# Patient Record
Sex: Female | Born: 1995 | Race: Black or African American | Hispanic: No | Marital: Single | Smoking: Never smoker
Health system: Southern US, Community
[De-identification: ages and names within clinical notes are randomized; demographics above are authoritative.]

## PROBLEM LIST (undated history)

## (undated) HISTORY — PX: TONSILLECTOMY: SUR1361

---

## 2018-01-06 ENCOUNTER — Emergency Department (HOSPITAL_COMMUNITY)
Admission: EM | Admit: 2018-01-06 | Discharge: 2018-01-07 | Disposition: A | Discharge status: Home / Self Care | Payer: Self-pay | Attending: Emergency Medicine | Admitting: Emergency Medicine

## 2018-01-06 ENCOUNTER — Encounter (HOSPITAL_COMMUNITY): Payer: Self-pay

## 2018-01-06 DIAGNOSIS — Z5321 Procedure and treatment not carried out due to patient leaving prior to being seen by health care provider: Secondary | ICD-10-CM | POA: Insufficient documentation

## 2018-01-06 LAB — URINALYSIS, ROUTINE W REFLEX MICROSCOPIC
Bilirubin Urine: NEGATIVE
Glucose, UA: NEGATIVE mg/dL
HGB URINE DIPSTICK: NEGATIVE
Ketones, ur: NEGATIVE mg/dL
Nitrite: NEGATIVE
Protein, ur: NEGATIVE mg/dL
Specific Gravity, Urine: 1.019 (ref 1.005–1.030)
pH: 6 (ref 5.0–8.0)

## 2018-01-06 LAB — POC URINE PREG, ED: Preg Test, Ur: NEGATIVE

## 2018-01-06 NOTE — ED Triage Notes (Signed)
Pt reports vaginal itching and burning for the past couple days, would like to be checked for a yeast infection.

## 2018-01-07 NOTE — ED Notes (Addendum)
Pt called for vital sign reassessment x3, no answer again.

## 2018-01-07 NOTE — ED Notes (Signed)
No answer for vitals recheck x3 

## 2018-07-15 ENCOUNTER — Other Ambulatory Visit: Payer: Self-pay

## 2018-07-15 ENCOUNTER — Emergency Department (HOSPITAL_COMMUNITY): Payer: No Typology Code available for payment source

## 2018-07-15 ENCOUNTER — Emergency Department (HOSPITAL_COMMUNITY)
Admission: EM | Admit: 2018-07-15 | Discharge: 2018-07-15 | Disposition: A | Discharge status: Home / Self Care | Payer: No Typology Code available for payment source | Attending: Emergency Medicine | Admitting: Emergency Medicine

## 2018-07-15 ENCOUNTER — Encounter (HOSPITAL_COMMUNITY): Payer: Self-pay | Admitting: *Deleted

## 2018-07-15 DIAGNOSIS — Y999 Unspecified external cause status: Secondary | ICD-10-CM | POA: Diagnosis not present

## 2018-07-15 DIAGNOSIS — Y9389 Activity, other specified: Secondary | ICD-10-CM | POA: Insufficient documentation

## 2018-07-15 DIAGNOSIS — Y92410 Unspecified street and highway as the place of occurrence of the external cause: Secondary | ICD-10-CM | POA: Diagnosis not present

## 2018-07-15 DIAGNOSIS — M5489 Other dorsalgia: Secondary | ICD-10-CM | POA: Insufficient documentation

## 2018-07-15 DIAGNOSIS — M7918 Myalgia, other site: Secondary | ICD-10-CM

## 2018-07-15 LAB — POC URINE PREG, ED: Preg Test, Ur: NEGATIVE

## 2018-07-15 MED ORDER — METHOCARBAMOL 500 MG PO TABS: 500.0000 mg | ORAL_TABLET | Freq: Every evening | ORAL | 0 refills | Status: AC | PRN
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
## 2018-07-15 NOTE — ED Notes (Signed)
Pt ambulatory to room with no apparent distress.

## 2018-07-15 NOTE — ED Notes (Signed)
Patient transported to X-ray 
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
## 2018-07-15 NOTE — Discharge Instructions (Addendum)

## 2020-09-07 ENCOUNTER — Emergency Department
Admission: EM | Admit: 2020-09-07 | Discharge: 2020-09-07 | Disposition: A | Discharge status: Home / Self Care | Payer: Self-pay | Attending: Emergency Medicine | Admitting: Emergency Medicine

## 2020-09-07 ENCOUNTER — Other Ambulatory Visit: Payer: Self-pay

## 2020-09-07 DIAGNOSIS — L740 Miliaria rubra: Secondary | ICD-10-CM | POA: Insufficient documentation

## 2020-09-07 DIAGNOSIS — L259 Unspecified contact dermatitis, unspecified cause: Secondary | ICD-10-CM | POA: Insufficient documentation

## 2020-09-07 MED ORDER — PREDNISONE 50 MG PO TABS: 50.0000 mg | ORAL_TABLET | Freq: Every day | ORAL | 0 refills | Status: AC

## 2020-09-07 MED ORDER — CETIRIZINE HCL 10 MG PO TABS: 20.0000 mg | ORAL_TABLET | Freq: Every day | ORAL | 0 refills | Status: AC

## 2020-09-07 MED ORDER — TRIAMCINOLONE ACETONIDE 0.1 % EX CREA: 1.0000 "application " | TOPICAL_CREAM | Freq: Four times a day (QID) | CUTANEOUS | 0 refills | Status: AC

## 2020-09-07 NOTE — ED Triage Notes (Signed)
Pt states she just got back from a trip to [facility] and noticed a rash on her legs and arms- pt states it is itchy- pt denies any allergies- pt states it started 2 days ago and has gradually gotten worse

## 2020-09-07 NOTE — ED Provider Notes (Signed)
[facility] Emergency Department Provider Note  ____________________________________________  Time seen: Approximately 7:25 PM  I have reviewed the triage vital signs and the nursing notes.   HISTORY  Chief Complaint Rash    HPI Helen Hernandez is a 25 y.o. female who presents the emergency department complaining of a rash.  Patient states that she has a pruritic rash that started 2 days ago and has gradually worsened.  She states that she is just her back from [place].  No new foods, medications, topicals, soaps or shampoos.  She states that they are small little bumps that are pruritic in nature.  No pain.  No fevers or chills.  No other systemic complaints.  No history of allergic reactions to foods or medications.       History reviewed. No pertinent past medical history.  There are no problems to display for this patient.   Past Surgical History:  Procedure Laterality Date   TONSILLECTOMY      Prior to Admission medications   Medication Sig Start Date End Date Taking? Authorizing Provider  cetirizine (ZYRTEC) 10 MG tablet Take 2 tablets (20 mg total) by mouth daily for 10 days. 09/07/20 09/17/20 Yes Marg Macmaster, Delorise Royals, PA-C  predniSONE (DELTASONE) 50 MG tablet Take 1 tablet (50 mg total) by mouth daily with breakfast. 09/07/20  Yes Donn Zanetti, Delorise Royals, PA-C  triamcinolone cream (KENALOG) 0.1 % Apply 1 application topically 4 (four) times daily. 09/07/20  Yes Josephine Rudnick, Delorise Royals, PA-C    Allergies Patient has no known allergies.  No family history on file.  Social History Social History   Tobacco Use   Smoking status: Never     Review of Systems  Constitutional: No fever/chills Eyes: No visual changes. No discharge ENT: No upper respiratory complaints. Cardiovascular: no chest pain. Respiratory: no cough. No SOB. Gastrointestinal: No abdominal pain.  No nausea, no vomiting.  No diarrhea.  No constipation. Musculoskeletal: Negative  for musculoskeletal pain. Skin: Positive for pruritic rash Neurological: Negative for headaches, focal weakness or numbness.  10 System ROS otherwise negative.  ____________________________________________   PHYSICAL EXAM:  VITAL SIGNS: ED Triage Vitals  Enc Vitals Group     BP 09/07/20 1635 112/73     Pulse Rate 09/07/20 1635 (!) 56     Resp 09/07/20 1635 16     Temp 09/07/20 1635 99 F (37.2 C)     Temp Source 09/07/20 1635 Oral     SpO2 09/07/20 1635 99 %     Weight 09/07/20 1634 186 lb (84.4 kg)     Height 09/07/20 1634 5\' 5"  (1.651 m)     Head Circumference --      Peak Flow --      Pain Score 09/07/20 1634 0     Pain Loc --      Pain Edu? --      Excl. in GC? --      Constitutional: Alert and oriented. Well appearing and in no acute distress. Eyes: Conjunctivae are normal. PERRL. EOMI. Head: Atraumatic. ENT:      Ears:       Nose: No congestion/rhinnorhea.      Mouth/Throat: Mucous membranes are moist.  Neck: No stridor.    Cardiovascular: Normal rate, regular rhythm. Normal S1 and S2.  Good peripheral circulation. Respiratory: Normal respiratory effort without tachypnea or retractions. Lungs CTAB. Good air entry to the bases with no decreased or absent breath sounds. Musculoskeletal: Full range of motion to all extremities. No gross  deformities appreciated. Neurologic:  Normal speech and language. No gross focal neurologic deficits are appreciated.  Skin:  Skin is warm, dry and intact.  Visualization of the majority of patient's skin reveals small maculopapular rash with no excoriations or cellulitic changes.  Patient has a collection to the right upper arm that appears more to be milia rubra than a true maculopapular rash.  However the remainder are scattered erythematous macular papular style lesions. Psychiatric: Mood and affect are normal. Speech and behavior are normal. Patient exhibits appropriate insight and  judgement.   ____________________________________________   LABS (all labs ordered are listed, but only abnormal results are displayed)  Labs Reviewed - No data to display ____________________________________________  EKG   ____________________________________________  RADIOLOGY   No results found.  ____________________________________________    PROCEDURES  Procedure(s) performed:    Procedures    Medications - No data to display   ____________________________________________   INITIAL IMPRESSION / ASSESSMENT AND PLAN / ED COURSE  Pertinent labs & imaging results that were available during my care of the patient were reviewed by me and considered in my medical decision making (see chart for details).  Review of the [place] CSRS was performed in accordance of the [facility] prior to dispensing any controlled drugs.           Patient's diagnosis is consistent with contact dermatitis, milia rubra.  Patient presented to the emergency department with a pruritic rash x2 days.  Patient has no systemic complaints in regards to fevers, chills, URI symptoms, body aches.  At this time there is no evidence of monkey box based off her rash.  Patient has what appears to be a contact dermatitis except 1 area that is very concentrated in the right upper extremity which appears more to be milia rubra.  I have recommended exfoliating soaps and scrubs at home as well as putting the patient on oral antihistamine and steroid to ensure that this is not a contact dermatitis.  Return precautions discussed with the patient.  Follow-up primary care as needed..  Patient is given ED precautions to return to the ED for any worsening or new symptoms.     ____________________________________________  FINAL CLINICAL IMPRESSION(S) / ED DIAGNOSES  Final diagnoses:  Contact dermatitis, unspecified contact dermatitis type, unspecified trigger  Miliaria rubra      NEW MEDICATIONS STARTED DURING  THIS VISIT:  ED Discharge Orders          Ordered    predniSONE (DELTASONE) 50 MG tablet  Daily with breakfast        09/07/20 1930    cetirizine (ZYRTEC) 10 MG tablet  Daily        09/07/20 1930    triamcinolone cream (KENALOG) 0.1 %  4 times daily        09/07/20 1930                This chart was dictated using voice recognition software/Dragon. Despite best efforts to proofread, errors can occur which can change the meaning. Any change was purely unintentional.    Racheal Patches, PA-C 09/07/20 1932    Phineas Semen, MD 09/07/20 2017

## 2020-12-25 ENCOUNTER — Other Ambulatory Visit: Payer: Self-pay

## 2020-12-25 ENCOUNTER — Encounter: Payer: Self-pay | Admitting: Emergency Medicine

## 2020-12-25 ENCOUNTER — Emergency Department: Payer: Medicaid Other

## 2020-12-25 DIAGNOSIS — R519 Headache, unspecified: Secondary | ICD-10-CM | POA: Insufficient documentation

## 2020-12-25 DIAGNOSIS — J029 Acute pharyngitis, unspecified: Secondary | ICD-10-CM | POA: Insufficient documentation

## 2020-12-25 DIAGNOSIS — R059 Cough, unspecified: Secondary | ICD-10-CM | POA: Insufficient documentation

## 2020-12-25 DIAGNOSIS — Z5321 Procedure and treatment not carried out due to patient leaving prior to being seen by health care provider: Secondary | ICD-10-CM | POA: Insufficient documentation

## 2020-12-25 DIAGNOSIS — R0981 Nasal congestion: Secondary | ICD-10-CM | POA: Insufficient documentation

## 2020-12-25 DIAGNOSIS — Z20822 Contact with and (suspected) exposure to covid-19: Secondary | ICD-10-CM | POA: Insufficient documentation

## 2020-12-25 NOTE — ED Triage Notes (Addendum)
Patient ambulatory to triage with steady gait, without difficulty or distress noted; pt reports since Sunday having HA, cough, congestion and sore throat

## 2020-12-26 ENCOUNTER — Emergency Department
Admission: EM | Admit: 2020-12-26 | Discharge: 2020-12-26 | Disposition: A | Discharge status: Home / Self Care | Payer: Medicaid Other | Attending: Emergency Medicine | Admitting: Emergency Medicine

## 2020-12-26 LAB — RESP PANEL BY RT-PCR (FLU A&B, COVID) ARPGX2
Influenza A by PCR: NEGATIVE
Influenza B by PCR: NEGATIVE
SARS Coronavirus 2 by RT PCR: NEGATIVE

## 2020-12-26 LAB — GROUP A STREP BY PCR: Group A Strep by PCR: NOT DETECTED

## 2021-02-12 ENCOUNTER — Other Ambulatory Visit: Payer: Self-pay

## 2021-02-12 ENCOUNTER — Encounter: Payer: Self-pay | Admitting: Emergency Medicine

## 2021-02-12 ENCOUNTER — Emergency Department
Admission: EM | Admit: 2021-02-12 | Discharge: 2021-02-12 | Disposition: A | Discharge status: Home / Self Care | Payer: No Typology Code available for payment source | Attending: Emergency Medicine | Admitting: Emergency Medicine

## 2021-02-12 DIAGNOSIS — S0990XA Unspecified injury of head, initial encounter: Secondary | ICD-10-CM | POA: Diagnosis not present

## 2021-02-12 DIAGNOSIS — Y92410 Unspecified street and highway as the place of occurrence of the external cause: Secondary | ICD-10-CM | POA: Insufficient documentation

## 2021-02-12 DIAGNOSIS — S199XXA Unspecified injury of neck, initial encounter: Secondary | ICD-10-CM | POA: Diagnosis present

## 2021-02-12 DIAGNOSIS — M542 Cervicalgia: Secondary | ICD-10-CM
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
## 2021-02-12 NOTE — ED Notes (Signed)
Pt provided discharge instructions and prescription information. Pt was given the opportunity to ask questions and questions were answered. Discharge signature not obtained in the setting of the COVID-19 pandemic in order to reduce high touch surfaces.  ° °
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
## 2022-09-09 IMAGING — CR DG CHEST 2V
2 series · 2 of 2 positions shown · non-contrast
Comparison: None.

CLINICAL DATA: Cough with headache and congestion.

EXAM:
CHEST - 2 VIEW

[chest pa]
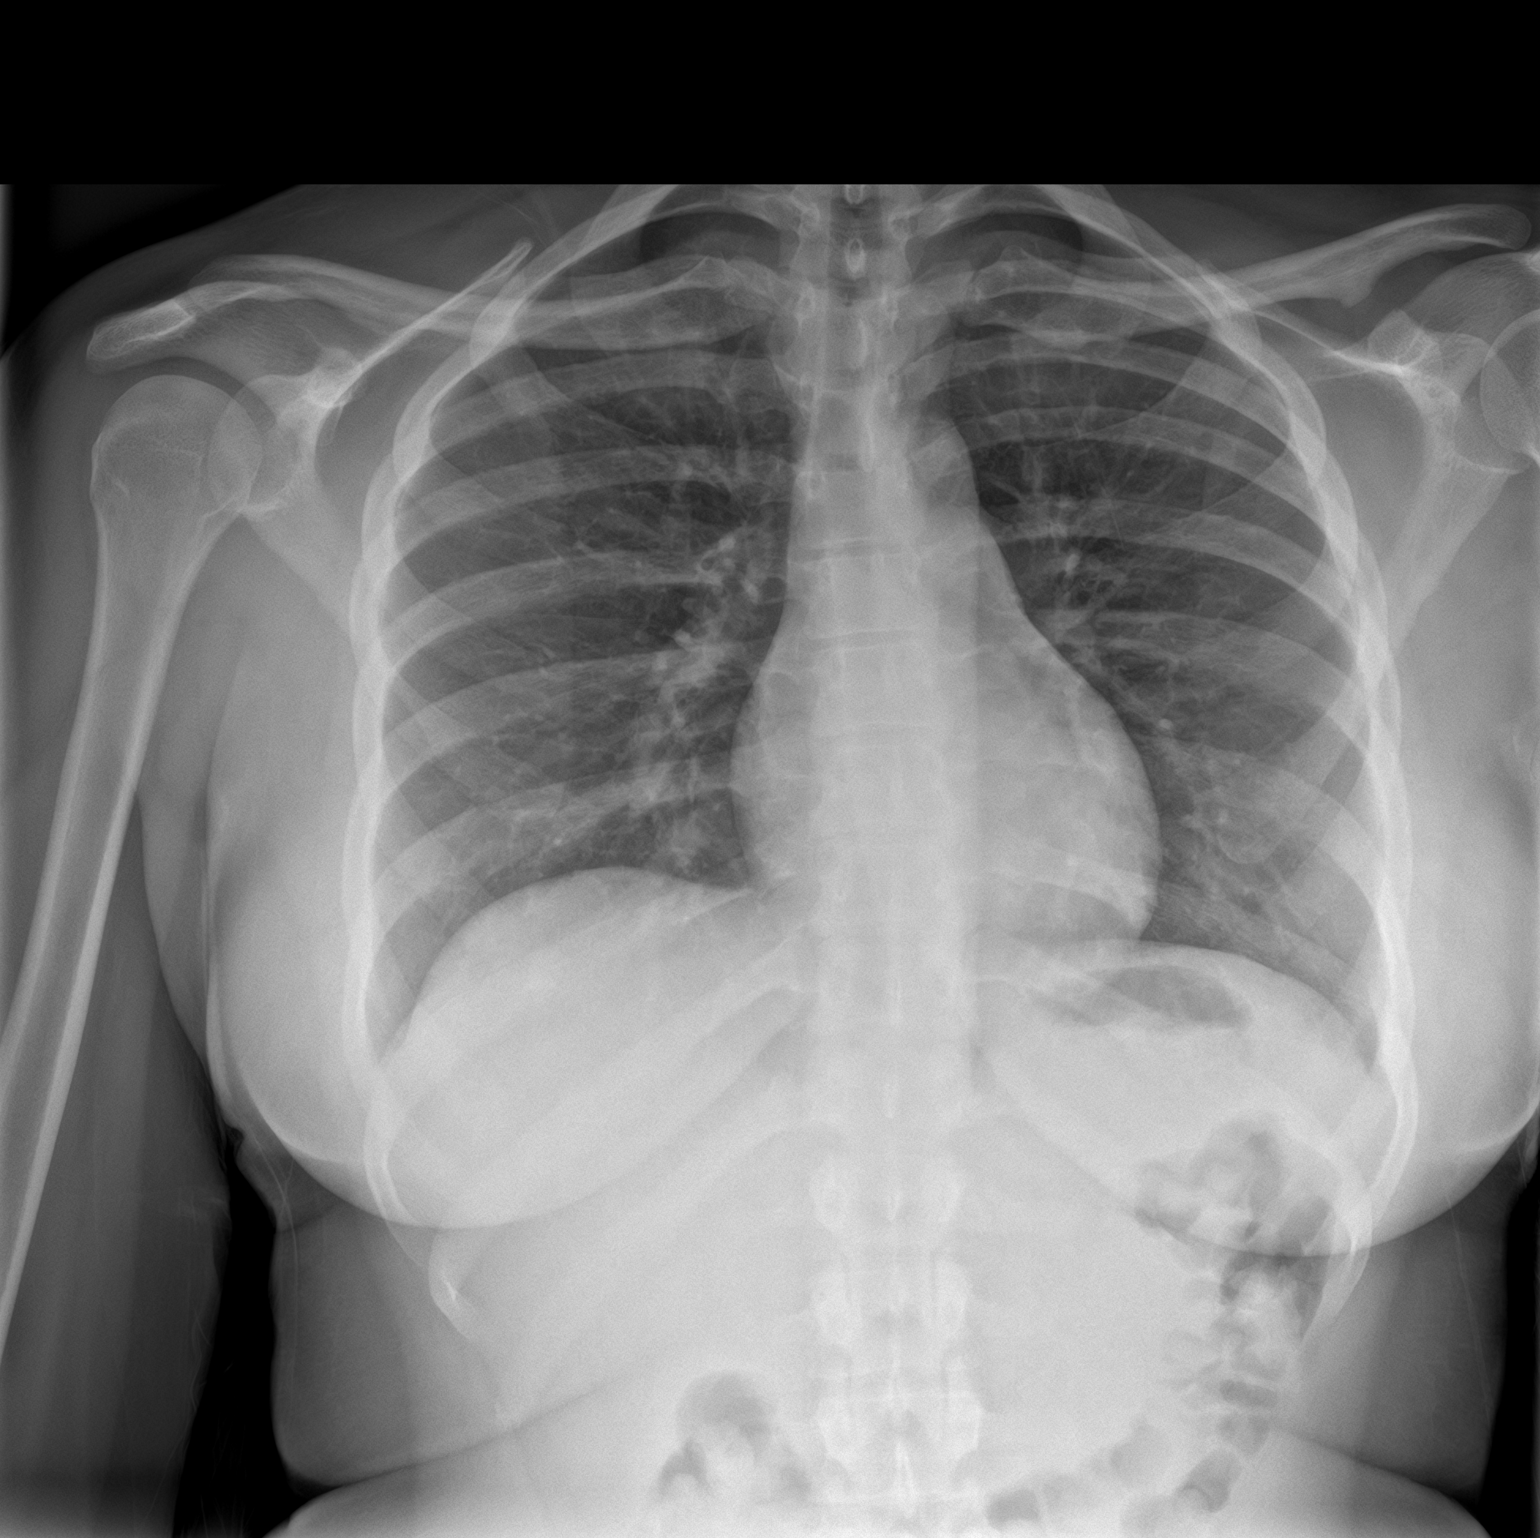

[chest lat]
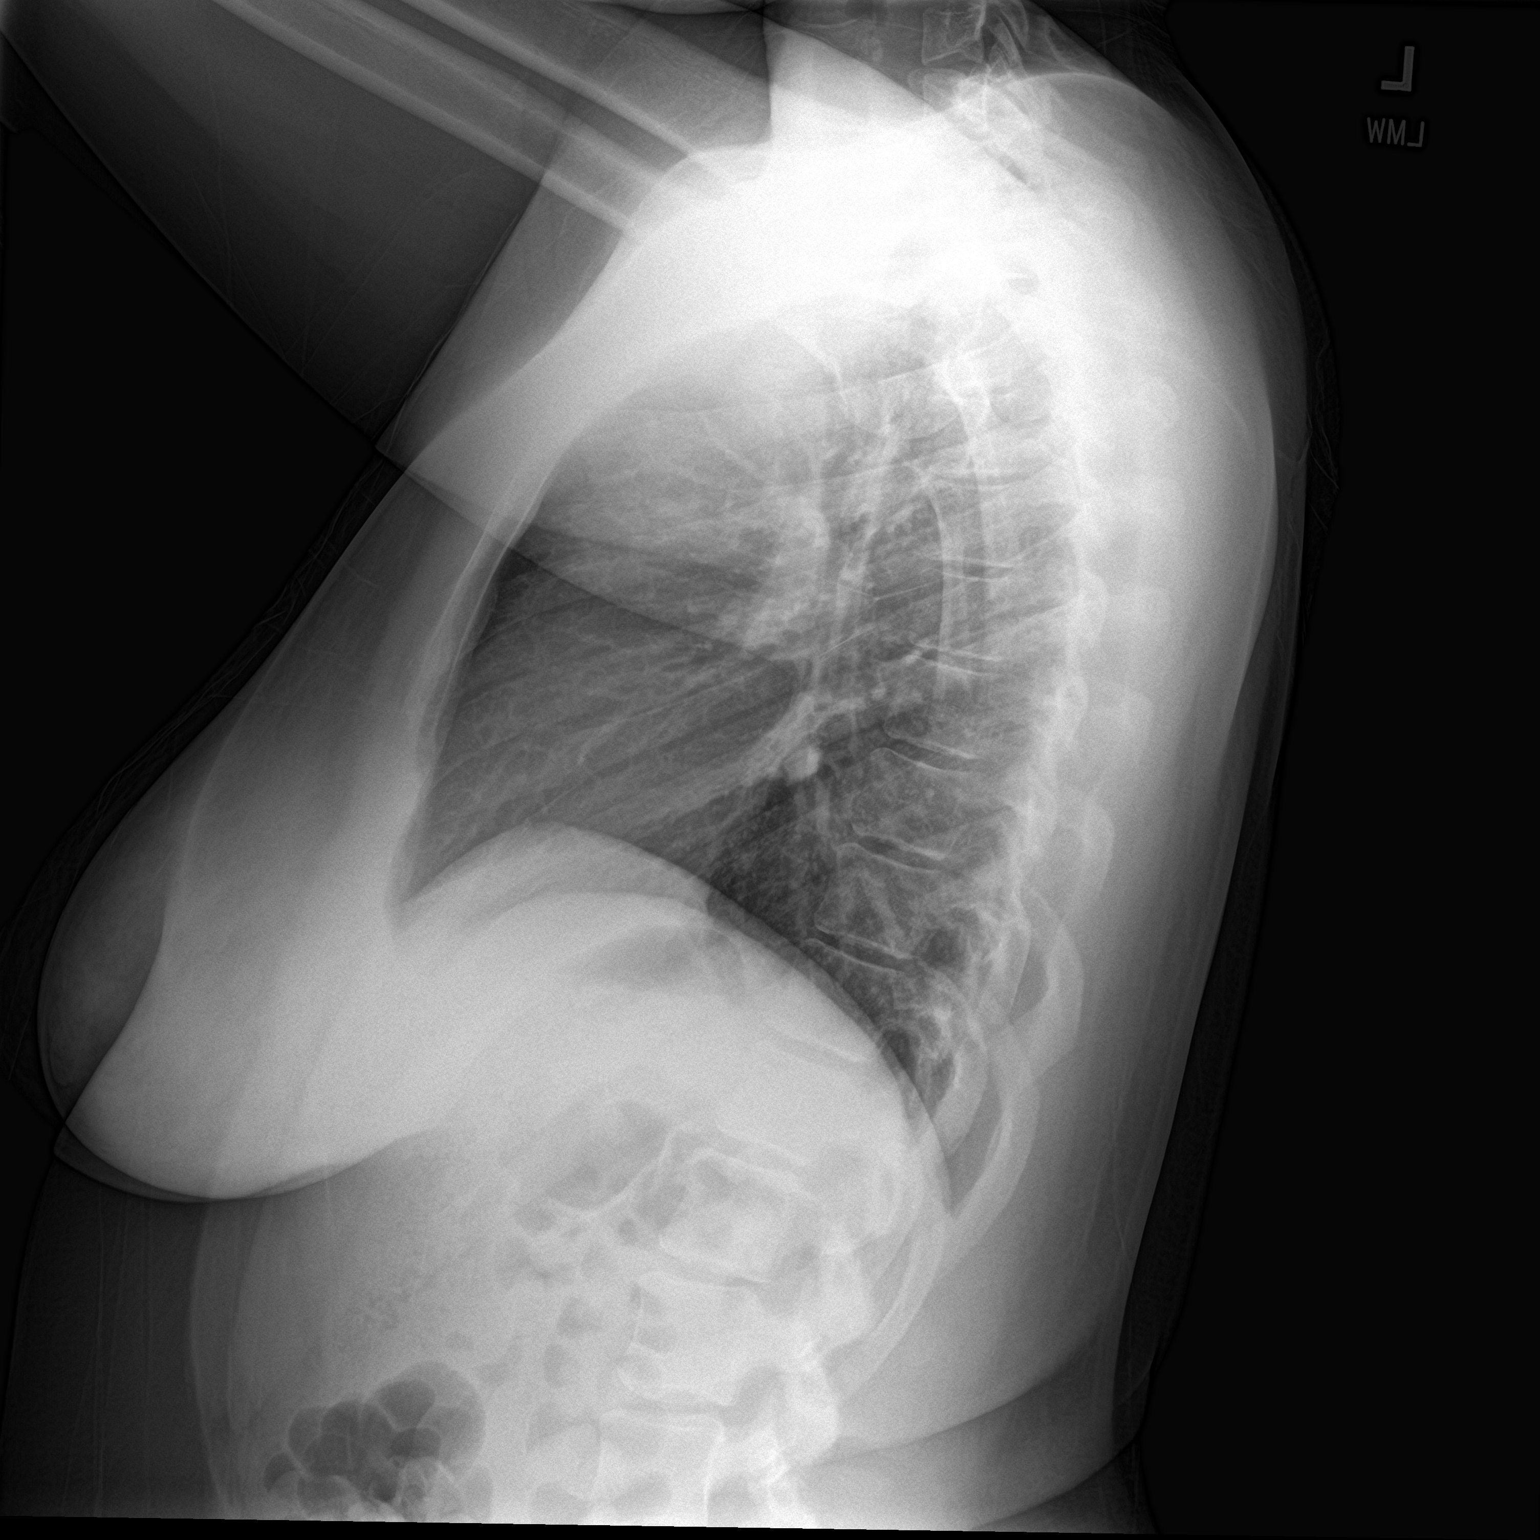

[2 of 2 positions shown; findings below may reference images not displayed]

FINDINGS: The heart size and mediastinal contours are within normal limits.
Both lungs are clear. The visualized skeletal structures are
unremarkable.
IMPRESSION: No active cardiopulmonary disease.
